# Patient Record
Sex: Female | Born: 1980 | Race: White | Hispanic: No | Marital: Married | Smoking: Never smoker
Health system: Southern US, Community
[De-identification: ages and names within clinical notes are randomized; demographics above are authoritative.]

## PROBLEM LIST (undated history)

## (undated) DIAGNOSIS — Z789 Other specified health status: Secondary | ICD-10-CM

## (undated) DIAGNOSIS — K08409 Partial loss of teeth, unspecified cause, unspecified class: Secondary | ICD-10-CM

## (undated) HISTORY — PX: WISDOM TOOTH EXTRACTION: SHX21

---

## 2002-12-17 ENCOUNTER — Other Ambulatory Visit: Admission: RE | Admit: 2002-12-17 | Discharge: 2002-12-17 | Payer: Self-pay | Admitting: Obstetrics and Gynecology

## 2003-05-24 ENCOUNTER — Observation Stay (HOSPITAL_COMMUNITY): Admission: AD | Admit: 2003-05-24 | Discharge: 2003-05-25 | Payer: Self-pay | Admitting: Obstetrics and Gynecology

## 2003-06-08 ENCOUNTER — Inpatient Hospital Stay (HOSPITAL_COMMUNITY): Admission: AD | Admit: 2003-06-08 | Discharge: 2003-06-10 | Payer: Self-pay | Admitting: Obstetrics and Gynecology

## 2003-06-11 ENCOUNTER — Encounter: Admission: RE | Admit: 2003-06-11 | Discharge: 2003-07-11 | Payer: Self-pay | Admitting: Obstetrics and Gynecology

## 2003-07-21 ENCOUNTER — Other Ambulatory Visit: Admission: RE | Admit: 2003-07-21 | Discharge: 2003-07-21 | Payer: Self-pay | Admitting: Obstetrics and Gynecology

## 2005-07-14 IMAGING — US US OB COMP +14 WK
1 series · 13 of 28 positions shown · non-contrast
Comparison: None.

CLINICAL DATA: Approximately 30 weeks gestation with vaginal bleeding.

[Series 1: unknown · 0.37mm/px · 13 of 30 slices shown]
[im 2/30]
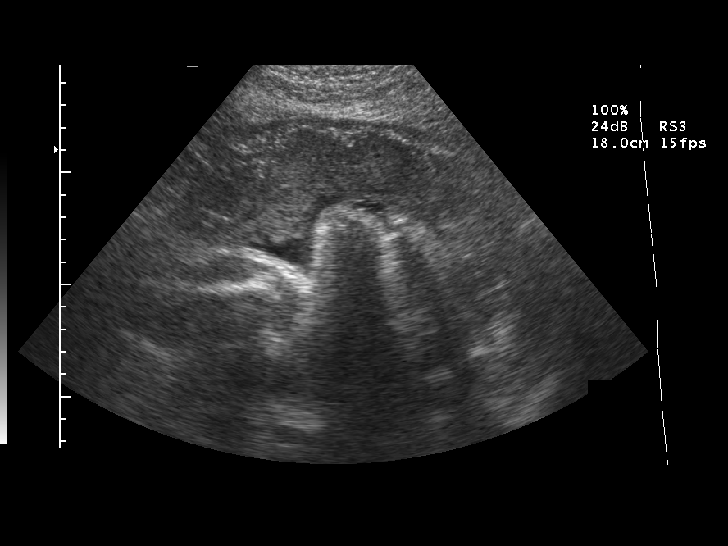
[im 4/30]
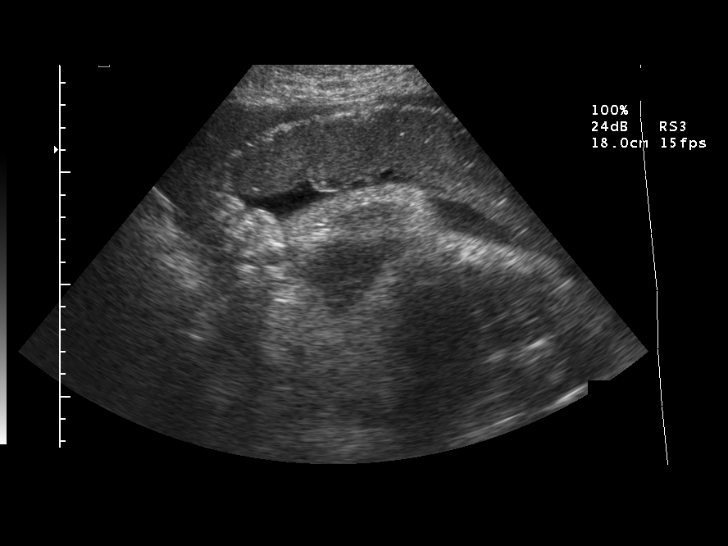
[im 6/30]
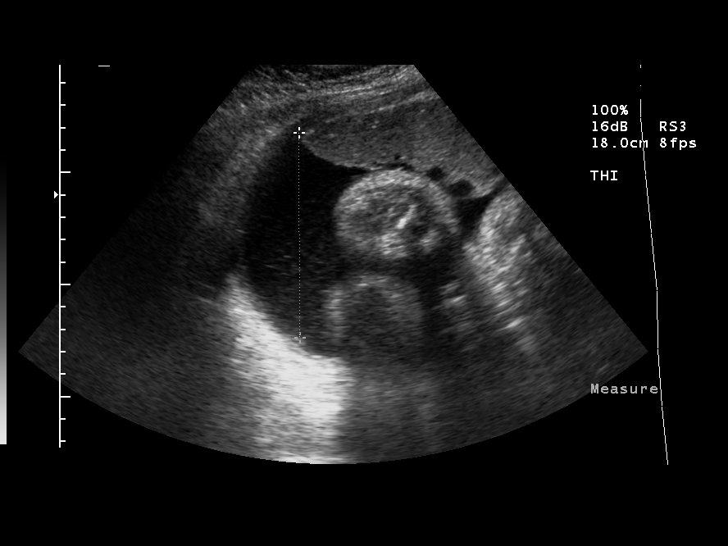
[im 8/30]
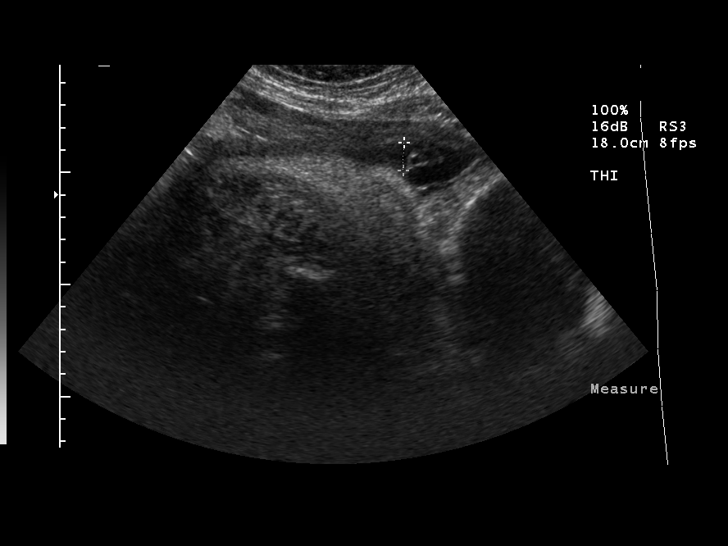
[im 10/30]
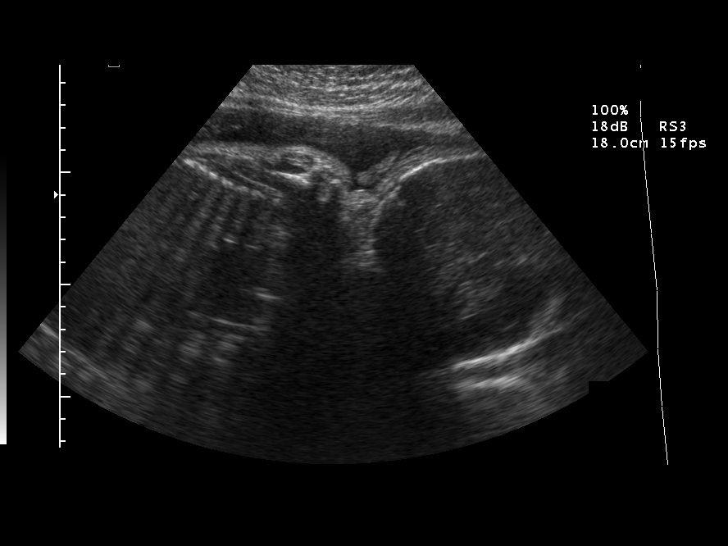
[im 12/30]
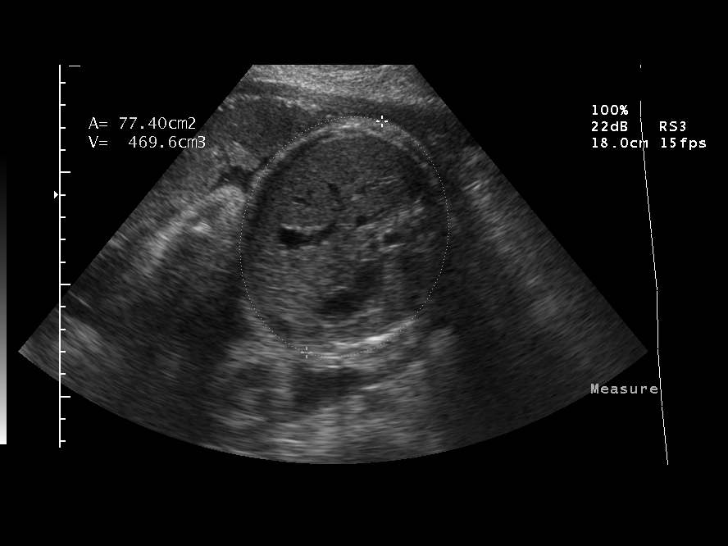
[im 16/30]
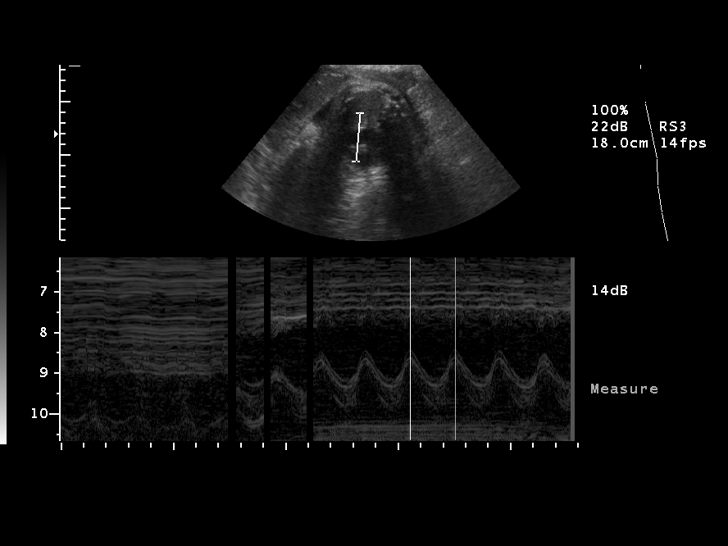
[im 18/30]
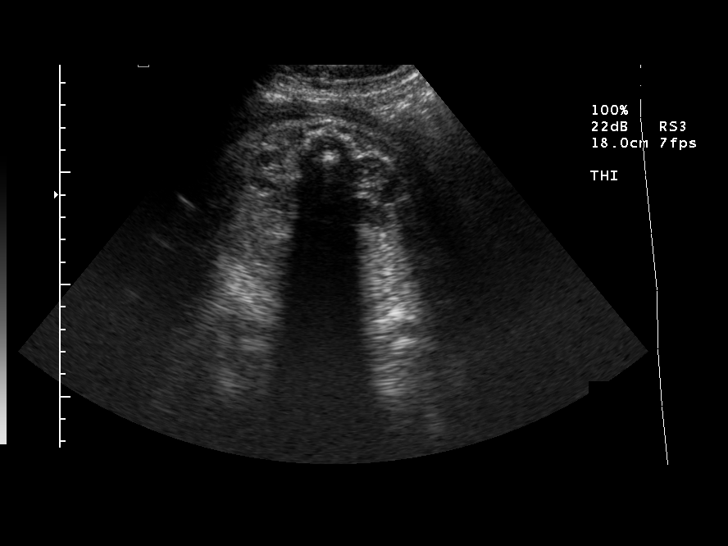
[im 20/30]
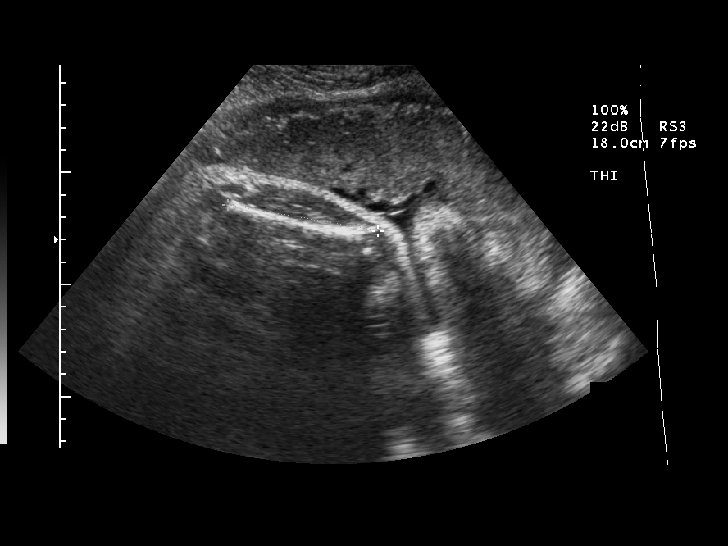
[im 22/30]
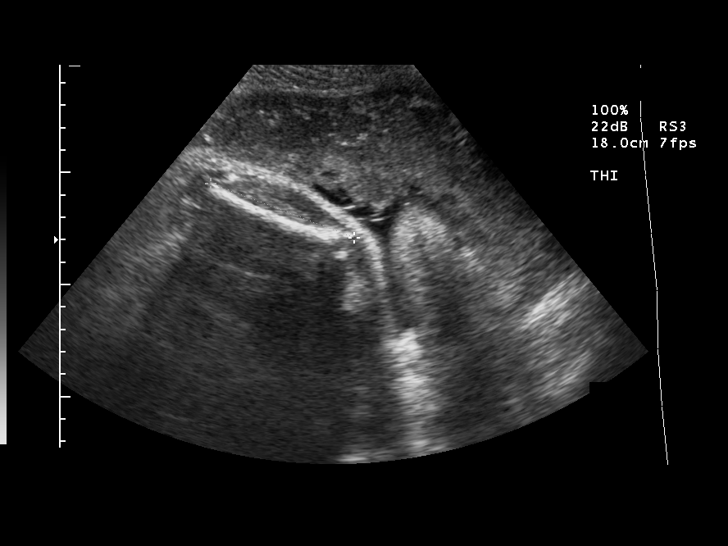
[im 24/30]
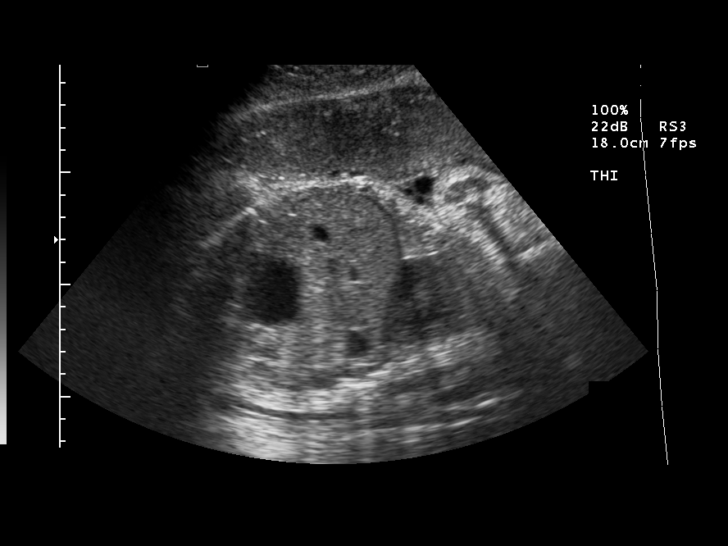
[im 26/30]
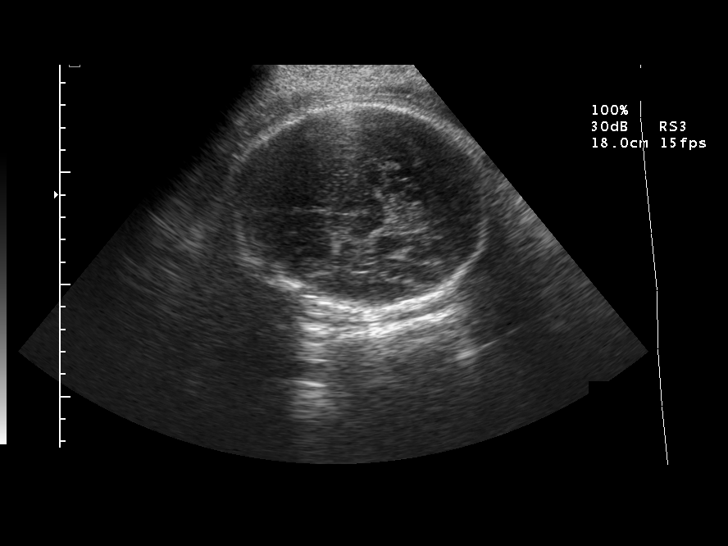
[im 28/30]
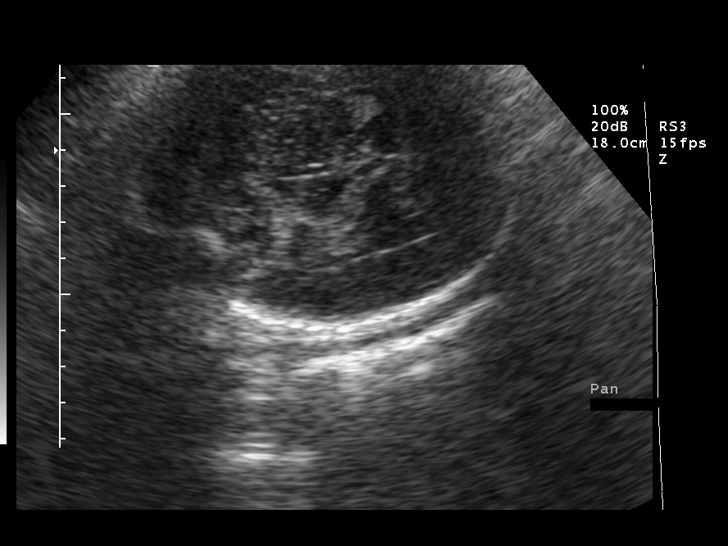

[13 of 28 positions shown; findings below may reference images not displayed]

OBSTETRICAL ULTRASOUND, 05/24/03

 NUMBER OF FETUSES:  1
 HEART RATE:   150 bpm
 MOVEMENT:  Yes
 BREATHING:  No  
 PRESENTATION:  Cephalic
 PLACENTAL LOCATION:  Anterior
 GRADE:  II
 PREVIA:  No
 AMNIOTIC FLUID (SUBJECTIVE):  Normal
 AMNIOTIC FLUID (OBJECTIVE):   16.9 cm (7.9 cm = 5th%ile; 95th %ile = 24.9 cm)
 FETAL BIOMETRY
 BPD:   9.0 cm  36W 3D
 HC:   32.3 cm  36W 3D
 AC:  31.5 cm  35W 3D
 FL:  6.8 cm  35W 0D
 MEAN GA:      35W 6D

 EFW:   2606 g (S) 54th-56th %ile (0895-7666 g) for 35 weeks

 FETAL ANATOMY
 LATERAL VENTRICLES:  Visualized 
 THALAMI/CSP:      Not visualized 
 POSTERIOR FOSSA:  Not visualized 
 NUCHAL REGION:  N/A
 SPINE:      Not visualized 
 4 CHAMBER HEART ON LEFT:      Not visualized 
 STOMACH ON LEFT:      Visualized 
 3 TIGER CORD:  Not visualized 
 CORD INSERTION SITE:  Not visualized 
 KIDNEYS:  Visualized 
 BLADDER:  Visualized 
 EXTREMITIES:      Not visualized 

 ADDITIONAL ANATOMY VISUALIZED:  Diaphragm.

 EVALUATION LIMITED BY:  Advanced gestational age.

 MATERNAL FINDINGS
 CERVIX:   4.0 cm translabially.
 IMPRESSION 
 Single living intrauterine fetus in cephalic presentation with subjective subjectively and quantitatively normal amniotic fluid volume.  Mean gestational age by fetal biometry is 35 weeks 6 days, which correlates well with the assigned gestational age at 35 weeks 1 day.
 The visualized fetal anatomy is limited by the advanced gestational age.

## 2006-09-05 ENCOUNTER — Inpatient Hospital Stay (HOSPITAL_COMMUNITY): Admission: RE | Admit: 2006-09-05 | Discharge: 2006-09-07 | Payer: Self-pay | Admitting: Obstetrics and Gynecology

## 2014-03-25 LAB — OB RESULTS CONSOLE ABO/RH: RH Type: POSITIVE

## 2014-03-25 LAB — OB RESULTS CONSOLE HIV ANTIBODY (ROUTINE TESTING): HIV: NONREACTIVE

## 2014-03-25 LAB — OB RESULTS CONSOLE RUBELLA ANTIBODY, IGM: Rubella: IMMUNE

## 2014-03-25 LAB — OB RESULTS CONSOLE ANTIBODY SCREEN: Antibody Screen: NEGATIVE

## 2014-03-25 LAB — OB RESULTS CONSOLE RPR: RPR: NONREACTIVE

## 2014-03-25 LAB — OB RESULTS CONSOLE HEPATITIS B SURFACE ANTIGEN: Hepatitis B Surface Ag: NEGATIVE

## 2014-04-01 LAB — OB RESULTS CONSOLE GC/CHLAMYDIA
Chlamydia: NEGATIVE
Gonorrhea: NEGATIVE

## 2014-07-15 NOTE — L&D Delivery Note (Signed)
Delivery Note At 1:43 PM a viable female was delivered via Vaginal, Spontaneous Delivery (Presentation: ; DOA ).  APGAR: 8, 9; weight  . pending  Placenta status: Intact, Spontaneous.  Cord: 2 vessels with the following complications: None.  Cord pH: not indicated  Anesthesia: Epidural  Episiotomy: None Lacerations: 2nd degree;Perineal;Periurethral Suture Repair: 3.0 vicryl rapide additional figure of 8 with 4-0 vicryl to close anterior periurethral laceration Est. Blood Loss (mL): 400 Some loss of uterine tone during perineal repair and methergine given. Plan close observation.   Mom to postpartum.  Baby to Couplet care / Skin to Skin.  Marlie Kuennen A. 10/21/2014, 2:29 PM

## 2014-09-13 ENCOUNTER — Inpatient Hospital Stay (HOSPITAL_COMMUNITY): Admission: AD | Admit: 2014-09-13 | Payer: Self-pay | Source: Ambulatory Visit | Admitting: Obstetrics

## 2014-10-06 LAB — OB RESULTS CONSOLE GBS: GBS: NEGATIVE

## 2014-10-13 ENCOUNTER — Telehealth (HOSPITAL_COMMUNITY): Payer: Self-pay | Admitting: *Deleted

## 2014-10-13 ENCOUNTER — Encounter (HOSPITAL_COMMUNITY): Payer: Self-pay | Admitting: *Deleted

## 2014-10-13 NOTE — Telephone Encounter (Signed)
Preadmission screen  

## 2014-10-19 ENCOUNTER — Other Ambulatory Visit: Payer: Self-pay | Admitting: Obstetrics

## 2014-10-21 ENCOUNTER — Inpatient Hospital Stay (HOSPITAL_COMMUNITY): Payer: Managed Care, Other (non HMO) | Admitting: Anesthesiology

## 2014-10-21 ENCOUNTER — Encounter (HOSPITAL_COMMUNITY): Payer: Self-pay

## 2014-10-21 ENCOUNTER — Inpatient Hospital Stay (HOSPITAL_COMMUNITY)
Admission: RE | Admit: 2014-10-21 | Discharge: 2014-10-22 | DRG: 775 | Disposition: A | Discharge status: Home / Self Care | Payer: Managed Care, Other (non HMO) | Source: Ambulatory Visit | Attending: Obstetrics | Admitting: Obstetrics

## 2014-10-21 DIAGNOSIS — Z833 Family history of diabetes mellitus: Secondary | ICD-10-CM | POA: Diagnosis not present

## 2014-10-21 DIAGNOSIS — O403XX0 Polyhydramnios, third trimester, not applicable or unspecified: Principal | ICD-10-CM | POA: Diagnosis present

## 2014-10-21 DIAGNOSIS — Z3A39 39 weeks gestation of pregnancy: Secondary | ICD-10-CM | POA: Diagnosis present

## 2014-10-21 DIAGNOSIS — Z3490 Encounter for supervision of normal pregnancy, unspecified, unspecified trimester: Secondary | ICD-10-CM

## 2014-10-21 HISTORY — DX: Partial loss of teeth, unspecified cause, unspecified class: K08.409

## 2014-10-21 HISTORY — DX: Other specified health status: Z78.9

## 2014-10-21 LAB — TYPE AND SCREEN
ABO/RH(D): AB POS
Antibody Screen: NEGATIVE

## 2014-10-21 LAB — CBC
HCT: 37.2 % (ref 36.0–46.0)
HEMOGLOBIN: 12.9 g/dL (ref 12.0–15.0)
MCH: 30.9 pg (ref 26.0–34.0)
MCHC: 34.7 g/dL (ref 30.0–36.0)
MCV: 89 fL (ref 78.0–100.0)
Platelets: 187 10*3/uL (ref 150–400)
RBC: 4.18 MIL/uL (ref 3.87–5.11)
RDW: 12.8 % (ref 11.5–15.5)
WBC: 8 10*3/uL (ref 4.0–10.5)

## 2014-10-21 LAB — ABO/RH: ABO/RH(D): AB POS

## 2014-10-21 LAB — RPR: RPR Ser Ql: NONREACTIVE

## 2014-10-21 MED ORDER — EPHEDRINE 5 MG/ML INJ
10.0000 mg | INTRAVENOUS | Status: DC | PRN
  Filled 2014-10-21: qty 2

## 2014-10-21 MED ORDER — LANOLIN HYDROUS EX OINT: TOPICAL_OINTMENT | CUTANEOUS | Status: DC | PRN

## 2014-10-21 MED ORDER — ONDANSETRON HCL 4 MG/2ML IJ SOLN: 4.0000 mg | INTRAMUSCULAR | Status: DC | PRN

## 2014-10-21 MED ORDER — LACTATED RINGERS IV SOLN: 500.0000 mL | INTRAVENOUS | Status: DC | PRN

## 2014-10-21 MED ORDER — OXYTOCIN 40 UNITS IN LACTATED RINGERS INFUSION - SIMPLE MED
62.5000 mL/h | INTRAVENOUS | Status: DC
  Administered 2014-10-21: 62.5 mL/h via INTRAVENOUS

## 2014-10-21 MED ORDER — CITRIC ACID-SODIUM CITRATE 334-500 MG/5ML PO SOLN: 30.0000 mL | ORAL | Status: DC | PRN

## 2014-10-21 MED ORDER — FENTANYL 2.5 MCG/ML BUPIVACAINE 1/10 % EPIDURAL INFUSION (WH - ANES)
INTRAMUSCULAR | Status: AC
  Administered 2014-10-21: 14 mL/h via EPIDURAL
  Filled 2014-10-21: qty 125

## 2014-10-21 MED ORDER — OXYCODONE-ACETAMINOPHEN 5-325 MG PO TABS: 1.0000 | ORAL_TABLET | ORAL | Status: DC | PRN

## 2014-10-21 MED ORDER — ONDANSETRON HCL 4 MG PO TABS: 4.0000 mg | ORAL_TABLET | ORAL | Status: DC | PRN

## 2014-10-21 MED ORDER — FENTANYL 2.5 MCG/ML BUPIVACAINE 1/10 % EPIDURAL INFUSION (WH - ANES)
14.0000 mL/h | INTRAMUSCULAR | Status: DC | PRN
  Administered 2014-10-21: 14 mL/h via EPIDURAL

## 2014-10-21 MED ORDER — LACTATED RINGERS IV SOLN
500.0000 mL | Freq: Once | INTRAVENOUS | Status: AC
  Administered 2014-10-21: 500 mL via INTRAVENOUS

## 2014-10-21 MED ORDER — ACETAMINOPHEN 325 MG PO TABS: 650.0000 mg | ORAL_TABLET | ORAL | Status: DC | PRN

## 2014-10-21 MED ORDER — PRENATAL MULTIVITAMIN CH
1.0000 | ORAL_TABLET | Freq: Every day | ORAL | Status: DC
  Administered 2014-10-22: 1 via ORAL
  Filled 2014-10-21: qty 1

## 2014-10-21 MED ORDER — OXYTOCIN BOLUS FROM INFUSION: 500.0000 mL | INTRAVENOUS | Status: DC

## 2014-10-21 MED ORDER — METHYLERGONOVINE MALEATE 0.2 MG/ML IJ SOLN: 0.2000 mg | Freq: Once | INTRAMUSCULAR | Status: DC

## 2014-10-21 MED ORDER — TETANUS-DIPHTH-ACELL PERTUSSIS 5-2.5-18.5 LF-MCG/0.5 IM SUSP: 0.5000 mL | Freq: Once | INTRAMUSCULAR | Status: DC

## 2014-10-21 MED ORDER — METHYLERGONOVINE MALEATE 0.2 MG/ML IJ SOLN
INTRAMUSCULAR | Status: AC
  Administered 2014-10-21: 0.2 mg
  Filled 2014-10-21: qty 1

## 2014-10-21 MED ORDER — FLEET ENEMA 7-19 GM/118ML RE ENEM: 1.0000 | ENEMA | Freq: Every day | RECTAL | Status: DC | PRN

## 2014-10-21 MED ORDER — LIDOCAINE HCL (PF) 1 % IJ SOLN
INTRAMUSCULAR | Status: DC | PRN
  Administered 2014-10-21: 4 mL

## 2014-10-21 MED ORDER — SIMETHICONE 80 MG PO CHEW: 80.0000 mg | CHEWABLE_TABLET | ORAL | Status: DC | PRN

## 2014-10-21 MED ORDER — OXYCODONE-ACETAMINOPHEN 5-325 MG PO TABS: 2.0000 | ORAL_TABLET | ORAL | Status: DC | PRN

## 2014-10-21 MED ORDER — SENNOSIDES-DOCUSATE SODIUM 8.6-50 MG PO TABS
2.0000 | ORAL_TABLET | ORAL | Status: DC
  Administered 2014-10-22: 1 via ORAL
  Filled 2014-10-21: qty 2

## 2014-10-21 MED ORDER — PHENYLEPHRINE 40 MCG/ML (10ML) SYRINGE FOR IV PUSH (FOR BLOOD PRESSURE SUPPORT)
80.0000 ug | PREFILLED_SYRINGE | INTRAVENOUS | Status: DC | PRN
  Filled 2014-10-21: qty 2

## 2014-10-21 MED ORDER — LIDOCAINE HCL (PF) 1 % IJ SOLN
30.0000 mL | INTRAMUSCULAR | Status: DC | PRN
  Filled 2014-10-21: qty 30

## 2014-10-21 MED ORDER — DIPHENHYDRAMINE HCL 25 MG PO CAPS: 25.0000 mg | ORAL_CAPSULE | Freq: Four times a day (QID) | ORAL | Status: DC | PRN

## 2014-10-21 MED ORDER — DIBUCAINE 1 % RE OINT: 1.0000 "application " | TOPICAL_OINTMENT | RECTAL | Status: DC | PRN

## 2014-10-21 MED ORDER — OXYTOCIN 40 UNITS IN LACTATED RINGERS INFUSION - SIMPLE MED
1.0000 m[IU]/min | INTRAVENOUS | Status: DC
  Administered 2014-10-21: 2 m[IU]/min via INTRAVENOUS
  Filled 2014-10-21: qty 1000

## 2014-10-21 MED ORDER — BISACODYL 10 MG RE SUPP: 10.0000 mg | Freq: Every day | RECTAL | Status: DC | PRN

## 2014-10-21 MED ORDER — FENTANYL 2.5 MCG/ML BUPIVACAINE 1/10 % EPIDURAL INFUSION (WH - ANES)
INTRAMUSCULAR | Status: DC | PRN
  Administered 2014-10-21: 14 mL/h via EPIDURAL

## 2014-10-21 MED ORDER — IBUPROFEN 600 MG PO TABS
600.0000 mg | ORAL_TABLET | Freq: Four times a day (QID) | ORAL | Status: DC
  Administered 2014-10-21 – 2014-10-22 (×4): 600 mg via ORAL
  Filled 2014-10-21 (×4): qty 1

## 2014-10-21 MED ORDER — BENZOCAINE-MENTHOL 20-0.5 % EX AERO
1.0000 "application " | INHALATION_SPRAY | CUTANEOUS | Status: DC | PRN
  Administered 2014-10-21: 1 via TOPICAL
  Filled 2014-10-21: qty 56

## 2014-10-21 MED ORDER — ZOLPIDEM TARTRATE 5 MG PO TABS: 5.0000 mg | ORAL_TABLET | Freq: Every evening | ORAL | Status: DC | PRN

## 2014-10-21 MED ORDER — WITCH HAZEL-GLYCERIN EX PADS: 1.0000 "application " | MEDICATED_PAD | CUTANEOUS | Status: DC | PRN

## 2014-10-21 MED ORDER — DIPHENHYDRAMINE HCL 50 MG/ML IJ SOLN: 12.5000 mg | INTRAMUSCULAR | Status: DC | PRN

## 2014-10-21 MED ORDER — ONDANSETRON HCL 4 MG/2ML IJ SOLN: 4.0000 mg | Freq: Four times a day (QID) | INTRAMUSCULAR | Status: DC | PRN

## 2014-10-21 MED ORDER — PHENYLEPHRINE 40 MCG/ML (10ML) SYRINGE FOR IV PUSH (FOR BLOOD PRESSURE SUPPORT)
PREFILLED_SYRINGE | INTRAVENOUS | Status: AC
  Filled 2014-10-21: qty 20

## 2014-10-21 MED ORDER — TERBUTALINE SULFATE 1 MG/ML IJ SOLN
0.2500 mg | Freq: Once | INTRAMUSCULAR | Status: DC | PRN
  Filled 2014-10-21: qty 1

## 2014-10-21 MED ORDER — LACTATED RINGERS IV SOLN
INTRAVENOUS | Status: DC
Start: 2014-10-21 — End: 2014-10-21
  Administered 2014-10-21 (×2): via INTRAVENOUS

## 2014-10-21 NOTE — Progress Notes (Signed)
Subjective: G3P2 at 39+ wks Induction Polyhydramnios/2 vessel cord Doing well, pain controled, UCs q2-3 min  Anesthesia epidural   Objective: BP 114/69 mmHg  Pulse 70  Temp(Src) 98.1 F (36.7 C) (Oral)  Resp 18  Ht 5\' 6"  (1.676 m)  Wt 180 lb (81.647 kg)  BMI 29.07 kg/m2  SpO2 98%  LMP 01/19/2014   FHT:  130/min with good variability and accelerations.  No deceleration UC:   regular, every 2-3 minutes VE:   3/85%/Vtx/-2-1 fixed.  AROM AF clear +++   Assessment / Plan: Induction of labor at 39+ wks multiparous with Polyhydramnios/2 vessel cord.  Pito/AROM entering active labor.    Fetal Wellbeing:  Category I Pain Control:  Epidural  Anticipated MOD:  NSVD  Cynthia Estes,Cynthia Estes 10/21/2014, 11:13 AM

## 2014-10-21 NOTE — Anesthesia Preprocedure Evaluation (Signed)
Anesthesia Evaluation  Patient identified by MRN, date of birth, ID band Patient awake    Reviewed: Allergy & Precautions, NPO status , Patient's Chart, lab work & pertinent test results  Airway Mallampati: II  TM Distance: >3 FB Neck ROM: Full    Dental no notable dental hx. (+) Dental Advisory Given   Pulmonary neg pulmonary ROS,  breath sounds clear to auscultation  Pulmonary exam normal       Cardiovascular negative cardio ROS  Rhythm:Regular Rate:Normal     Neuro/Psych negative neurological ROS  negative psych ROS   GI/Hepatic negative GI ROS, Neg liver ROS,   Endo/Other  negative endocrine ROS  Renal/GU negative Renal ROS  negative genitourinary   Musculoskeletal negative musculoskeletal ROS (+)   Abdominal   Peds negative pediatric ROS (+)  Hematology negative hematology ROS (+)   Anesthesia Other Findings   Reproductive/Obstetrics (+) Pregnancy                             Anesthesia Physical Anesthesia Plan  ASA: II  Anesthesia Plan: Epidural   Post-op Pain Management:    Induction:   Airway Management Planned:   Additional Equipment:   Intra-op Plan:   Post-operative Plan:   Informed Consent: I have reviewed the patients History and Physical, chart, labs and discussed the procedure including the risks, benefits and alternatives for the proposed anesthesia with the patient or authorized representative who has indicated his/her understanding and acceptance.   Dental advisory given  Plan Discussed with:   Anesthesia Plan Comments:         Anesthesia Quick Evaluation

## 2014-10-21 NOTE — H&P (Signed)
Cynthia Estes is a 34 y.o. G3P1002 at 3315w2d presenting for elective IOL. Pt notes rare contractions. Good fetal movement, No vaginal bleeding, not leaking fluid  [facility] first trimester wks - DS 185, nl 3 hr - 2 VC - early previa with resolution - fetal growth 37 wks- AGA- 76%, 7'2, ant placenta   Prenatal Transfer Tool  Maternal Diabetes: No Genetic Screening: Normal Maternal Ultrasounds/Referrals: Normal Fetal Ultrasounds or other Referrals:  None Maternal Substance Abuse:  No Significant Maternal Medications:  None Significant Maternal Lab Results: None     OB History    Gravida Para Term Preterm AB TAB SAB Ectopic Multiple Living   3 2 1       2      Past Medical History  Diagnosis Date  . Medical history non-contributory    Past Surgical History  Procedure Laterality Date  . Wisdom tooth extraction     Family History: family history includes Diabetes in her maternal grandfather; Heart disease in her paternal grandfather and paternal grandmother. Social History:  reports that she has never smoked. She does not have any smokeless tobacco history on file. Her alcohol and drug histories are not on file.  Review of Systems - Negative except discomfort of preg   Dilation: 10 Effacement (%): 90 Station: 0 Exam by:: Dr. Ernestina Pennafogleman Blood pressure 105/70, pulse 72, temperature 97.8 F (36.6 C), temperature source Oral, resp. rate 18, height 5\' 6"  (1.676 m), weight 81.647 kg (180 lb), last menstrual period 01/19/2014, SpO2 98 %.  Physical Exam:  Gen: well appearing, no distress Abd: gravid, NT, no RUQ pain LE: no edema, equal bilaterally, non-tender   Prenatal labs: ABO, Rh: --/--/AB POS (04/08 0745) Antibody: NEG (04/08 0745) Rubella:  immune RPR: Nonreactive (09/11 0000)  HBsAg: Negative (09/11 0000)  HIV: Non-reactive (09/11 0000)  GBS: Negative (03/24 0000)  1 hr Glucola 185. Nl 3 hr  Genetic screening nl NT, nl AFP Anatomy US  nl    Assessment/Plan: 34 y.o. G3P1002 at 5715w2d Elective IOL. Pit/ AROM Reactive fetal testing GBS neg   Cynthia Estes A. 10/21/2014, 2:25 PM

## 2014-10-21 NOTE — Anesthesia Procedure Notes (Signed)
Epidural Patient location during procedure: OB Start time: 10/21/2014 10:30 AM  Staffing Anesthesiologist: Karie SchwalbeJUDD, Narek Kniss Performed by: anesthesiologist   Preanesthetic Checklist Completed: patient identified, site marked, surgical consent, pre-op evaluation, timeout performed, IV checked, risks and benefits discussed and monitors and equipment checked  Epidural Patient position: sitting Prep: site prepped and draped and DuraPrep Patient monitoring: continuous pulse ox and blood pressure Approach: midline Location: L3-L4 Injection technique: LOR saline  Needle:  Needle type: Tuohy  Needle gauge: 17 G Needle length: 9 cm and 9 Needle insertion depth: 6 cm Catheter type: closed end flexible Catheter size: 19 Gauge Catheter at skin depth: 10 cm Test dose: negative  Assessment Events: blood not aspirated, injection not painful, no injection resistance, negative IV test and no paresthesia  Additional Notes Patient identified. Risks/Benefits/Options discussed with patient including but not limited to bleeding, infection, nerve damage, paralysis, failed block, incomplete pain control, headache, blood pressure changes, nausea, vomiting, reactions to medication both or allergic, itching and postpartum back pain. Confirmed with bedside nurse the patient's most recent platelet count. Confirmed with patient that they are not currently taking any anticoagulation, have any bleeding history or any family history of bleeding disorders. Patient expressed understanding and wished to proceed. All questions were answered. Sterile technique was used throughout the entire procedure. Please see nursing notes for vital signs. Test dose was given through epidural catheter and negative prior to continuing to dose epidural or start infusion. Warning signs of high block given to the patient including shortness of breath, tingling/numbness in hands, complete motor block, or any concerning symptoms with instructions to  call for help. Patient was given instructions on fall risk and not to get out of bed. All questions and concerns addressed with instructions to call with any issues or inadequate analgesia.

## 2014-10-21 NOTE — Lactation Note (Signed)
This note was copied from the chart of Cynthia Leonel Ramsayracy Lamping. Lactation Consultation Note  Patient Name: Cynthia Estes XBJYN'WToday's Date: 10/21/2014 Reason for consult: Initial assessment (mom encouraged to call with feeding cues. )  Baby is 3 hours old and presently skin to skin under moms gown skin to skin , sound asleep. LC reviewed the benefits of skin to skin . Per mom the baby has been spitty and has not had a poop. LC encouraged mom to call for feeding cues on the nurses light. Per mom - did not breast feed her 1st baby or 2nd baby. Desires to try this time if it doesn't work out it is ok. Mother informed of post-discharge support and given phone number to the lactation department, including  services for phone call assistance; out-patient appointments; and breastfeeding support group. List of other  breastfeeding resources in the community given in the handout. Encouraged mother to call for problems or concerns related to breastfeeding.   Maternal Data Does the patient have breastfeeding experience prior to this delivery?: Yes  Feeding Feeding Type:  (per mom tried earlier, baby has been sleepy and spitty. ) Length of feed: 0 min  LATCH Score/Interventions Latch: Too sleepy or reluctant, no latch achieved, no sucking elicited. Intervention(s): Skin to skin;Teach feeding cues  Audible Swallowing: None Intervention(s): Skin to skin  Type of Nipple: Flat Intervention(s): Reverse pressure  Comfort (Breast/Nipple): Soft / non-tender     Hold (Positioning): Assistance needed to correctly position infant at breast and maintain latch. Intervention(s): Breastfeeding basics reviewed  LATCH Score: 4  Lactation Tools Discussed/Used WIC Program: No   Consult Status Consult Status: Follow-up Date: 10/21/14 Follow-up type: In-patient    Cynthia Estes, Cynthia Estes 10/21/2014, 5:11 PM

## 2014-10-22 ENCOUNTER — Encounter (HOSPITAL_COMMUNITY): Payer: Self-pay

## 2014-10-22 LAB — CBC
HEMATOCRIT: 32.7 % — AB (ref 36.0–46.0)
Hemoglobin: 11.5 g/dL — ABNORMAL LOW (ref 12.0–15.0)
MCH: 31.5 pg (ref 26.0–34.0)
MCHC: 35.2 g/dL (ref 30.0–36.0)
MCV: 89.6 fL (ref 78.0–100.0)
Platelets: 156 10*3/uL (ref 150–400)
RBC: 3.65 MIL/uL — ABNORMAL LOW (ref 3.87–5.11)
RDW: 12.9 % (ref 11.5–15.5)
WBC: 12.2 10*3/uL — ABNORMAL HIGH (ref 4.0–10.5)

## 2014-10-22 MED ORDER — IBUPROFEN 600 MG PO TABS: 600.0000 mg | ORAL_TABLET | Freq: Four times a day (QID) | ORAL | Status: AC

## 2014-10-22 NOTE — Progress Notes (Signed)
Patient ID: Cynthia Estes, female   DOB: [birth year], 34 y.o.   MRN: [id number] PPD # 1 SVD  S:  Reports feeling well, desires early discharge home             Tolerating po/ No nausea or vomiting             Bleeding is light             Pain controlled with ibuprofen (OTC)             Up ad lib / ambulatory / voiding without difficulties    Newborn  Information for the patient's newborn:  Nona DellJohnson, Boy [name] [[id number]][[id number]]  female  breast feeding  / Circumcision in progress   O:  A & O x 3, in no apparent distress              VS:  Filed Vitals:   10/21/14 1552 10/21/14 1655 10/21/14 2118 10/22/14 0540  BP: 109/69 107/68 99/53 97/58   Pulse: 71 80 84 72  Temp: 98.2 F (36.8 C) 98.3 F (36.8 C) 99 F (37.2 C) 98 F (36.7 C)  TempSrc: Oral Oral Oral Oral  Resp: 17 16 18 18   Height:      Weight:      SpO2:        LABS:  Recent Labs  10/21/14 0745 10/22/14 0615  WBC 8.0 12.2*  HGB 12.9 11.5*  HCT 37.2 32.7*  PLT 187 156    Blood type: AB POS (04/08 0745)  Rubella: Immune (09/11 0000)     Lungs: Clear and unlabored  Heart: regular rate and rhythm / no murmurs  Abdomen: soft, non-tender, non-distended              Fundus: firm, non-tender, U-3  Perineum: 2nd degree repair healing well, no edema, ecchymosis, erythema   Lochia: minimal  Extremities: no edema, no calf pain or tenderness, no Homans    A/P: PPD # 1  33 y.o., [id number]   Principal Problem:    Postpartum care following vaginal delivery (4/8)    Doing well - stable status  Routine post partum orders  Early discharge home today   Kawena Lyday, M, MSN, CNM 10/22/2014, 9:30 AM

## 2014-10-22 NOTE — Discharge Summary (Signed)
Obstetric Discharge Summary  Reason for Admission: Pt is a G3P2003 at 5976w2d admitted for elective IOL for Polyhydramnios / 2 vessel cord.  [facility] since 9.2 wks, with Dr. Ernestina PennaFogleman as primary provider.  Medications on Admission: Prescriptions prior to admission  Medication Sig Dispense Refill Last Dose  . Docosahexaenoic Acid (DHA PO) Take 1 tablet by mouth daily.   10/21/2014 at Unknown time  . Prenatal Vit-Fe Fumarate-FA (PRENATAL MULTIVITAMIN) TABS tablet Take 1 tablet by mouth daily at 12 noon.   10/21/2014 at Unknown time    Prenatal Labs: ABO, Rh: AB POS (04/08 0745)  Antibody: NEG (04/08 0745) Rubella: Immune (09/11 0000)   RPR: Non Reactive (04/08 0745)  HBsAg: Negative (09/11 0000)  HIV: Non-reactive (09/11 0000)  GTT : Abnormal 1 hr GTT / Normal 3 hr GTT GBS: Negative (03/24 0000)   Prenatal Procedures: ultrasound Intrapartum Course: Admitted for IOL for Polyhydramnios & 2 vessel cord / epidural for pain management / rapid progression to complete dilation / SVD of viable female with 2nd degree;Perineal;Periurethral repair by Dr. Ernestina PennaFogleman / no immediate postpartum complications noted Intrapartum Procedures: spontaneous vaginal delivery Postpartum Procedures: none Complications-Operative and Postpartum: 2nd degree perineal laceration and periurethral  Labs: HEMOGLOBIN  Date Value Ref Range Status  10/22/2014 11.5* 12.0 - 15.0 g/dL Final   HCT  Date Value Ref Range Status  10/22/2014 32.7* 36.0 - 46.0 % Final   Lab Results  Component Value Date   PLT 156 10/22/2014    Newborn Data: Live born female  Birth Weight: 8 lb (3630 g) APGAR: 8, 9  Home with mother.   Discharge Information: Date: 10/22/2014 Discharge Diagnoses:  Pt is a G3P2003 at 1576w2d S/P Term Pregnancy-delivered on 10/21/2014  Condition: stable Activity: pelvic rest Diet: routine Medications:    Medication List    TAKE these medications        DHA PO  Take 1  tablet by mouth daily.     ibuprofen 600 MG tablet  Commonly known as:  ADVIL,MOTRIN  Take 1 tablet (600 mg total) by mouth every 6 (six) hours.     prenatal multivitamin Tabs tablet  Take 1 tablet by mouth daily at 12 noon.       Instructions: [facility] instruction booklet has been given and reviewed Discharge to: home     Follow-up Information    [facility] Schedule an appointment as soon as possible for a visit in 6 weeks.   Specialty:  Obstetrics and Gynecology   Why:  postpartum visit   Contact information:   [address] [place] [id number] [phone]       Raelyn MoraAWSON, Irean Kendricks, Judie PetitM, MSN, CNM 10/22/2014, 10:58 AM

## 2014-10-22 NOTE — Discharge Instructions (Signed)
Breast Pumping Tips °If you are breastfeeding, there may be times when you cannot feed your baby directly. Returning to work or going on a trip are common examples. Pumping allows you to store breast milk and feed it to your baby later.  °You may not get much milk when you first start to pump. Your breasts should start to make more after a few days. If you pump at the times you usually feed your baby, you may be able to keep making enough milk to feed your baby without also using formula. The more often you pump, the more milk you will produce.  °WHEN SHOULD I PUMP?  °· You can begin to pump soon after delivery. However, some experts recommend waiting about 4 weeks before giving your infant a bottle to make sure breastfeeding is going well.  °· If you plan to return to work, begin pumping a few weeks before. This will help you develop techniques that work best for you. It also lets you build up a supply of breast milk.   °· When you are with your infant, feed on demand and pump after each feeding.   °· When you are away from your infant for several hours, pump for about 15 minutes every 2-3 hours. Pump both breasts at the same time if you can.   °· If your infant has a formula feeding, make sure to pump around the same time.     °· If you drink any alcohol, wait 2 hours before pumping.   °HOW DO I PREPARE TO PUMP? °Your let-down reflex is the natural reaction to stimulation that makes your breast milk flow. It is easier to stimulate this reflex when you are relaxed. Find relaxation techniques that work for you. If you have difficulty with your let-down reflex, try these methods:  °· Smell one of your infant's blankets or an item of clothing.   °· Look at a picture or video of your infant.   °· Sit in a quiet, private space.   °· Massage the breast you plan to pump.   °· Place soothing warmth on the breast.   °· Play relaxing music.   °WHAT ARE SOME GENERAL BREAST PUMPING TIPS? °· Wash your hands before you pump. You  do not need to wash your nipples or breasts. °· There are three ways to pump. °¨ You can use your hand to massage and compress your breast. °¨ You can use a handheld manual pump. °¨ You can use an electric pump.   °· Make sure the suction cup (flange) on the breast pump is the right size. Place the flange directly over the nipple. If it is the wrong size or placed the wrong way, it may be painful and cause nipple damage.   °· If pumping is uncomfortable, apply a small amount of purified or modified lanolin to your nipple and areola. °· If you are using an electric pump, adjust the speed and suction power to be more comfortable. °· If pumping is painful or if you are not getting very much milk, you may need a different type of pump. A lactation consultant can help you determine what type of pump to use.   °· Keep a full water bottle near you at all times. Drinking lots of fluid helps you make more milk.  °· You can store your milk to use later. Pumped breast milk can be stored in a sealable, sterile container or plastic bag. Label all stored breast milk with the date you pumped it. °¨ Milk can stay out at room temperature for up to 8 hours. °¨   You can store your milk in the refrigerator for up to 8 days. °¨ You can store your milk in the freezer for 3 months. Thaw frozen milk using warm water. Do not put it in the microwave. °· Do not smoke. Smoking can lower your milk supply and harm your infant. If you need help quitting, ask your health care provider to recommend a program.   °WHEN SHOULD I CALL MY HEALTH CARE PROVIDER OR A LACTATION CONSULTANT? °· You are having trouble pumping. °· You are concerned that you are not making enough milk. °· You have nipple pain, soreness, or redness. °· You want to use birth control. Birth control pills may lower your milk supply. Talk to your health care provider about your options. °Document Released: 12/19/2009 Document Revised: 07/06/2013 Document Reviewed:  04/23/2013 °Exit[employer]® Patient Information ©2015 [employer]. This information is not intended to replace advice given to you by your health care provider. Make sure you discuss any questions you have with your health care provider. ° °Nutrition for the New Mother  °A new mother needs good health and nutrition so she can have energy to take care of a new baby. Whether a mother breastfeeds or formula feeds the baby, it is important to have a well-balanced diet. Foods from all the food groups should be chosen to meet the new mother's energy needs and to give her the nutrients needed for repair and healing.  °A HEALTHY EATING PLAN °The My [employer] plan for Moms outlines what you should eat to help you and your baby stay healthy. The energy and amount of food you need depends on whether or not you are breastfeeding. If you are breastfeeding you will need more nutrients. If you choose not to breastfeed, your nutrition goal should be to return to a healthy weight. Limiting calories may be needed if you are not breastfeeding.  °HOME CARE INSTRUCTIONS  °· For a personal plan based on your unique needs, see your Registered Dietitian or visit www.mypyramid.gov. °· Eat a variety of foods. The plan below will help guide you. The following chart has a suggested daily meal plan from the [employer] for Moms. °· Eat a variety of fruits and vegetables. °· Eat more dark green and orange vegetables and cooked dried beans. °· Make half your grains whole grains. Choose whole instead of refined grains. °· Choose low-fat or lean meats and poultry. °· Choose low-fat or fat-free dairy products like milk, cheese, or yogurt. °Fruits °· Breastfeeding: 2 cups °· Non-Breastfeeding: 2 cups °· What Counts as a serving? °¨ 1 cup of fruit or juice. °¨ ½ cup dried fruit. °Vegetables °· Breastfeeding: 3 cups °· Non-Breastfeeding: 2 ½ cups °· What Counts as a serving? °¨ 1 cup raw or cooked vegetables. °¨ Juice or 2 cups raw leafy  vegetables. °Grains °· Breastfeeding: 8 oz °· Non-Breastfeeding: 6 oz °· What Counts as a serving? °¨ 1 slice bread. °¨ 1 oz ready-to-eat cereal. °¨ ½ cup cooked pasta, rice, or cereal. °Meat and Beans °· Breastfeeding: 6 ½ oz °· Non-Breastfeeding: 5 ½ oz °· What Counts as a serving? °¨ 1 oz lean meat, poultry, or fish °¨ ¼ cup cooked dry beans °¨ ½ oz nuts or 1 egg °¨ 1 tbs peanut butter °Milk °· Breastfeeding: 3 cups °· Non-Breastfeeding: 3 cups °· What Counts as a serving? °¨ 1 cup milk. °¨ 8 oz yogurt. °¨ 1 ½ oz cheese. °¨ 2 oz processed cheese. °TIPS FOR THE BREASTFEEDING MOM °· Rapid weight   loss is not suggested when you are breastfeeding. By simply breastfeeding, you will be able to lose the weight gained during your pregnancy. Your caregiver can keep track of your weight and tell you if your weight loss is appropriate. °· Be sure to drink fluids. You may notice that you are thirstier than usual. A suggestion is to drink a glass of water or other beverage whenever you breastfeed. °· Avoid alcohol as it can be passed into your breast milk. °· Limit caffeine drinks to no more than 2 to 3 cups per day. °· You may need to keep taking your prenatal vitamin while you are breastfeeding. Talk with your caregiver about taking a vitamin or supplement. °RETURING TO A HEALTHY WEIGHT °· The My [employer] Plan for Moms will help you return to a healthy weight. It will also provide the nutrients you need. °· You may need to limit "empty" calories. These include: °¨ High fat foods like fried foods, fatty meats, fast food, butter, and mayonnaise. °¨ High sugar foods like sodas, jelly, candy, and sweets. °· Be physically active. Include 30 minutes of exercise or more each day. Choose an activity you like such as walking, swimming, biking, or aerobics. Check with your caregiver before you start to exercise. °Document Released: 10/08/2007 Document Revised: 09/23/2011 Document Reviewed: 10/08/2007 °ExitCare® Patient Information  ©2015 [employer]. This information is not intended to replace advice given to you by your health care provider. Make sure you discuss any questions you have with your health care provider. °Postpartum Depression and Baby Blues °The postpartum period begins right after the birth of a baby. During this time, there is often a great amount of joy and excitement. It is also a time of many changes in the life of the parents. Regardless of how many times a mother gives birth, each child brings new challenges and dynamics to the family. It is not unusual to have feelings of excitement along with confusing shifts in moods, emotions, and thoughts. All mothers are at risk of developing postpartum depression or the "baby blues." These mood changes can occur right after giving birth, or they may occur many months after giving birth. The baby blues or postpartum depression can be mild or severe. Additionally, postpartum depression can go away rather quickly, or it can be a long-term condition.  °CAUSES °Raised hormone levels and the rapid drop in those levels are thought to be a main cause of postpartum depression and the baby blues. A number of hormones change during and after pregnancy. Estrogen and progesterone usually decrease right after the delivery of your baby. The levels of thyroid hormone and various cortisol steroids also rapidly drop. Other factors that play a role in these mood changes include major life events and genetics.  °RISK FACTORS °If you have any of the following risks for the baby blues or postpartum depression, know what symptoms to watch out for during the postpartum period. Risk factors that may increase the likelihood of getting the baby blues or postpartum depression include: °· Having a personal or family history of depression.   °· Having depression while being pregnant.   °· Having premenstrual mood issues or mood issues related to oral contraceptives. °· Having a lot of life stress.   °· Having  marital conflict.   °· Lacking a social support network.   °· Having a baby with special needs.   °· Having health problems, such as diabetes.   °SIGNS AND SYMPTOMS °Symptoms of baby blues include: °· Brief changes in mood, such as going   from extreme happiness to sadness. °· Decreased concentration.   °· Difficulty sleeping.   °· Crying spells, tearfulness.   °· Irritability.   °· Anxiety.   °Symptoms of postpartum depression typically begin within the first month after giving birth. These symptoms include: °· Difficulty sleeping or excessive sleepiness.   °· Marked weight loss.   °· Agitation.   °· Feelings of worthlessness.   °· Lack of interest in activity or food.   °Postpartum psychosis is a very serious condition and can be dangerous. Fortunately, it is rare. Displaying any of the following symptoms is cause for immediate medical attention. Symptoms of postpartum psychosis include:  °· Hallucinations and delusions.   °· Bizarre or disorganized behavior.   °· Confusion or disorientation.   °DIAGNOSIS  °A diagnosis is made by an evaluation of your symptoms. There are no medical or lab tests that lead to a diagnosis, but there are various questionnaires that a health care provider may use to identify those with the baby blues, postpartum depression, or psychosis. Often, a screening tool called the Edinburgh Postnatal Depression Scale is used to diagnose depression in the postpartum period.  °TREATMENT °The baby blues usually goes away on its own in 1-2 weeks. Social support is often all that is needed. You will be encouraged to get adequate sleep and rest. Occasionally, you may be given medicines to help you sleep.  °Postpartum depression requires treatment because it can last several months or longer if it is not treated. Treatment may include individual or group therapy, medicine, or both to address any social, physiological, and psychological factors that may play a role in the depression. Regular exercise, a  healthy diet, rest, and social support may also be strongly recommended.  °Postpartum psychosis is more serious and needs treatment right away. Hospitalization is often needed. °HOME CARE INSTRUCTIONS °· Get as much rest as you can. Nap when the baby sleeps.   °· Exercise regularly. Some women find yoga and walking to be beneficial.   °· Eat a balanced and nourishing diet.   °· Do little things that you enjoy. Have a cup of tea, take a bubble bath, read your favorite magazine, or listen to your favorite music. °· Avoid alcohol.   °· Ask for help with household chores, cooking, grocery shopping, or running errands as needed. Do not try to do everything.   °· Talk to people close to you about how you are feeling. Get support from your partner, family members, friends, or other new moms. °· Try to stay positive in how you think. Think about the things you are grateful for.   °· Do not spend a lot of time alone.   °· Only take over-the-counter or prescription medicine as directed by your health care provider. °· Keep all your postpartum appointments.   °· Let your health care provider know if you have any concerns.   °SEEK MEDICAL CARE IF: °You are having a reaction to or problems with your medicine. °SEEK IMMEDIATE MEDICAL CARE IF: °· You have suicidal feelings.   °· You think you may harm the baby or someone else. °MAKE SURE YOU: °· Understand these instructions. °· Will watch your condition. °· Will get help right away if you are not doing well or get worse. °Document Released: 04/04/2004 Document Revised: 07/06/2013 Document Reviewed: 04/12/2013 °Exit[employer]® Patient Information ©2015 [employer]. This information is not intended to replace advice given to you by your health care provider. Make sure you discuss any questions you have with your health care provider. °Breastfeeding and Mastitis °Mastitis is inflammation of the breast tissue. It can occur in women who   are breastfeeding. This can make breastfeeding  painful. Mastitis will sometimes go away on its own. Your health care provider will help determine if treatment is needed. °CAUSES °Mastitis is often associated with a blocked milk (lactiferous) duct. This can happen when too much milk builds up in the breast. Causes of excess milk in the breast can include: °· Poor latch-on. If your baby is not latched onto the breast properly, she or he may not empty your breast completely while breastfeeding. °· Allowing too much time to pass between feedings. °· Wearing a bra or other clothing that is too tight. This puts extra pressure on the lactiferous ducts so milk does not flow through them as it should. °Mastitis can also be caused by a bacterial infection. Bacteria may enter the breast tissue through cuts or openings in the skin. In women who are breastfeeding, this may occur because of cracked or irritated skin. Cracks in the skin are often caused when your baby does not latch on properly to the breast. °SIGNS AND SYMPTOMS °· Swelling, redness, tenderness, and pain in an area of the breast. °· Swelling of the glands under the arm on the same side. °· Fever may or may not accompany mastitis. °If an infection is allowed to progress, a collection of pus (abscess) may develop. °DIAGNOSIS  °Your health care provider can usually diagnose mastitis based on your symptoms and a physical exam. Tests may be done to help confirm the diagnosis. These may include: °· Removal of pus from the breast by applying pressure to the area. This pus can be examined in the lab to determine which bacteria are present. If an abscess has developed, the fluid in the abscess can be removed with a needle. This can also be used to confirm the diagnosis and determine the bacteria present. In most cases, pus will not be present. °· Blood tests to determine if your body is fighting a bacterial infection. °· Mammogram or ultrasound tests to rule out other problems or diseases. °TREATMENT  °Mastitis that  occurs with breastfeeding will sometimes go away on its own. Your health care provider may choose to wait 24 hours after first seeing you to decide whether a prescription medicine is needed. If your symptoms are worse after 24 hours, your health care provider will likely prescribe an antibiotic medicine to treat the mastitis. He or she will determine which bacteria are most likely causing the infection and will then select an appropriate antibiotic medicine. This is sometimes changed based on the results of tests performed to identify the bacteria, or if there is no response to the antibiotic medicine selected. Antibiotic medicines are usually given by mouth. You may also be given medicine for pain. °HOME CARE INSTRUCTIONS °· Only take over-the-counter or prescription medicines for pain, fever, or discomfort as directed by your health care provider. °· If your health care provider prescribed an antibiotic medicine, take the medicine as directed. Make sure you finish it even if you start to feel better. °· Do not wear a tight or underwire bra. Wear a soft, supportive bra. °· Increase your fluid intake, especially if you have a fever. °· Continue to empty the breast. Your health care provider can tell you whether this milk is safe for your infant or needs to be thrown out. You may be told to stop nursing until your health care provider thinks it is safe for your baby. Use a breast pump if you are advised to stop nursing. °· Keep your nipples   clean and dry. °· Empty the first breast completely before going to the other breast. If your baby is not emptying your breasts completely for some reason, use a breast pump to empty your breasts. °· If you go back to work, pump your breasts while at work to stay in time with your nursing schedule. °· Avoid allowing your breasts to become overly filled with milk (engorged). °SEEK MEDICAL CARE IF: °· You have pus-like discharge from the breast. °· Your symptoms do not improve with  the treatment prescribed by your health care provider within 2 days. °SEEK IMMEDIATE MEDICAL CARE IF: °· Your pain and swelling are getting worse. °· You have pain that is not controlled with medicine. °· You have a red line extending from the breast toward your armpit. °· You have a fever or persistent symptoms for more than 2-3 days. °· You have a fever and your symptoms suddenly get worse. °MAKE SURE YOU:  °· Understand these instructions. °· Will watch your condition. °· Will get help right away if you are not doing well or get worse. °Document Released: 10/26/2004 Document Revised: 07/06/2013 Document Reviewed: 02/04/2013 °[employer]® Patient Information ©2015 [employer]. This information is not intended to replace advice given to you by your health care provider. Make sure you discuss any questions you have with your health care provider. °Breastfeeding °Deciding to breastfeed is one of the best choices you can make for you and your baby. A change in hormones during pregnancy causes your breast tissue to grow and increases the number and size of your milk ducts. These hormones also allow proteins, sugars, and fats from your blood supply to make breast milk in your milk-producing glands. Hormones prevent breast milk from being released before your baby is born as well as prompt milk flow after birth. Once breastfeeding has begun, thoughts of your baby, as well as his or her sucking or crying, can stimulate the release of milk from your milk-producing glands.  °BENEFITS OF BREASTFEEDING °For Your Baby °· Your first milk (colostrum) helps your baby's digestive system function better.   °· There are antibodies in your milk that help your baby fight off infections.   °· Your baby has a lower incidence of asthma, allergies, and sudden infant death syndrome.   °· The nutrients in breast milk are better for your baby than infant formulas and are designed uniquely for your baby's needs.   °· Breast milk improves your  baby's brain development.   °· Your baby is less likely to develop other conditions, such as childhood obesity, asthma, or type 2 diabetes mellitus.   °For You  °· Breastfeeding helps to create a very special bond between you and your baby.   °· Breastfeeding is convenient. Breast milk is always available at the correct temperature and costs nothing.   °· Breastfeeding helps to burn calories and helps you lose the weight gained during pregnancy.   °· Breastfeeding makes your uterus contract to its prepregnancy size faster and slows bleeding (lochia) after you give birth.   °· Breastfeeding helps to lower your risk of developing type 2 diabetes mellitus, osteoporosis, and breast or ovarian cancer later in life. °SIGNS THAT YOUR BABY IS HUNGRY °Early Signs of Hunger  °· Increased alertness or activity. °· Stretching. °· Movement of the head from side to side. °· Movement of the head and opening of the mouth when the corner of the mouth or cheek is stroked (rooting). °· Increased sucking sounds, smacking lips, cooing, sighing, or squeaking. °· Hand-to-mouth movements. °· Increased sucking of   fingers or hands. °Late Signs of Hunger °· Fussing. °· Intermittent crying. °Extreme Signs of Hunger °Signs of extreme hunger will require calming and consoling before your baby will be able to breastfeed successfully. Do not wait for the following signs of extreme hunger to occur before you initiate breastfeeding:   °· Restlessness. °· A loud, strong cry. °·  Screaming. °BREASTFEEDING BASICS °Breastfeeding Initiation °· Find a comfortable place to sit or lie down, with your neck and back well supported. °· Place a pillow or rolled up blanket under your baby to bring him or her to the level of your breast (if you are seated). Nursing pillows are specially designed to help support your arms and your baby while you breastfeed. °· Make sure that your baby's abdomen is facing your abdomen.   °· Gently massage your breast. With your  fingertips, massage from your chest wall toward your nipple in a circular motion. This encourages milk flow. You may need to continue this action during the feeding if your milk flows slowly. °· Support your breast with 4 fingers underneath and your thumb above your nipple. Make sure your fingers are well away from your nipple and your baby's mouth.   °· Stroke your baby's lips gently with your finger or nipple.   °· When your baby's mouth is open wide enough, quickly bring your baby to your breast, placing your entire nipple and as much of the colored area around your nipple (areola) as possible into your baby's mouth.   °¨ More areola should be visible above your baby's upper lip than below the lower lip.   °¨ Your baby's tongue should be between his or her lower gum and your breast.   °· Ensure that your baby's mouth is correctly positioned around your nipple (latched). Your baby's lips should create a seal on your breast and be turned out (everted). °· It is common for your baby to suck about 2-3 minutes in order to start the flow of breast milk. °Latching °Teaching your baby how to latch on to your breast properly is very important. An improper latch can cause nipple pain and decreased milk supply for you and poor weight gain in your baby. Also, if your baby is not latched onto your nipple properly, he or she may swallow some air during feeding. This can make your baby fussy. Burping your baby when you switch breasts during the feeding can help to get rid of the air. However, teaching your baby to latch on properly is still the best way to prevent fussiness from swallowing air while breastfeeding. °Signs that your baby has successfully latched on to your nipple:    °· Silent tugging or silent sucking, without causing you pain.   °· Swallowing heard between every 3-4 sucks.   °·  Muscle movement above and in front of his or her ears while sucking.   °Signs that your baby has not successfully latched on to  nipple:  °· Sucking sounds or smacking sounds from your baby while breastfeeding. °· Nipple pain. °If you think your baby has not latched on correctly, slip your finger into the corner of your baby's mouth to break the suction and place it between your baby's gums. Attempt breastfeeding initiation again. °Signs of Successful Breastfeeding °Signs from your baby:   °· A gradual decrease in the number of sucks or complete cessation of sucking.   °· Falling asleep.   °· Relaxation of his or her body.   °· Retention of a small amount of milk in his or her mouth.   °· Letting go   of your breast by himself or herself. °Signs from you: °· Breasts that have increased in firmness, weight, and size 1-3 hours after feeding.   °· Breasts that are softer immediately after breastfeeding. °· Increased milk volume, as well as a change in milk consistency and color by the fifth day of breastfeeding.   °· Nipples that are not sore, cracked, or bleeding. °Signs That Your Baby is Getting Enough Milk °· Wetting at least 3 diapers in a 24-hour period. The urine should be clear and pale yellow by age 5 days. °· At least 3 stools in a 24-hour period by age 5 days. The stool should be soft and yellow. °· At least 3 stools in a 24-hour period by age 7 days. The stool should be seedy and yellow. °· No loss of weight greater than 10% of birth weight during the first 3 days of age. °· Average weight gain of 4-7 ounces (113-198 g) per week after age 4 days. °· Consistent daily weight gain by age 5 days, without weight loss after the age of 2 weeks. °After a feeding, your baby may spit up a small amount. This is common. °BREASTFEEDING FREQUENCY AND DURATION °Frequent feeding will help you make more milk and can prevent sore nipples and breast engorgement. Breastfeed when you feel the need to reduce the fullness of your breasts or when your baby shows signs of hunger. This is called "breastfeeding on demand." Avoid introducing a pacifier to your  baby while you are working to establish breastfeeding (the first 4-6 weeks after your baby is born). After this time you may choose to use a pacifier. Research has shown that pacifier use during the first year of a baby's life decreases the risk of sudden infant death syndrome (SIDS). °Allow your baby to feed on each breast as long as he or she wants. Breastfeed until your baby is finished feeding. When your baby unlatches or falls asleep while feeding from the first breast, offer the second breast. Because newborns are often sleepy in the first few weeks of life, you may need to awaken your baby to get him or her to feed. °Breastfeeding times will vary from baby to baby. However, the following rules can serve as a guide to help you ensure that your baby is properly fed: °· Newborns (babies 4 weeks of age or younger) may breastfeed every 1-3 hours. °· Newborns should not go longer than 3 hours during the day or 5 hours during the night without breastfeeding. °· You should breastfeed your baby a minimum of 8 times in a 24-hour period until you begin to introduce solid foods to your baby at around 6 months of age. °BREAST MILK PUMPING °Pumping and storing breast milk allows you to ensure that your baby is exclusively fed your breast milk, even at times when you are unable to breastfeed. This is especially important if you are going back to work while you are still breastfeeding or when you are not able to be present during feedings. Your lactation consultant can give you guidelines on how long it is safe to store breast milk.  °A breast pump is a machine that allows you to pump milk from your breast into a sterile bottle. The pumped breast milk can then be stored in a refrigerator or freezer. Some breast pumps are operated by hand, while others use electricity. Ask your lactation consultant which type will work best for you. Breast pumps can be purchased, but some hospitals and breastfeeding support groups   lease  breast pumps on a monthly basis. A lactation consultant can teach you how to hand express breast milk, if you prefer not to use a pump.  °CARING FOR YOUR BREASTS WHILE YOU BREASTFEED °Nipples can become dry, cracked, and sore while breastfeeding. The following recommendations can help keep your breasts moisturized and healthy: °· Avoid using soap on your nipples.   °· Wear a supportive bra. Although not required, special nursing bras and tank tops are designed to allow access to your breasts for breastfeeding without taking off your entire bra or top. Avoid wearing underwire-style bras or extremely tight bras. °· Air dry your nipples for 3-4 minutes after each feeding.   °· Use only cotton bra pads to absorb leaked breast milk. Leaking of breast milk between feedings is normal.   °· Use lanolin on your nipples after breastfeeding. Lanolin helps to maintain your skin's normal moisture barrier. If you use pure lanolin, you do not need to wash it off before feeding your baby again. Pure lanolin is not toxic to your baby. You may also hand express a few drops of breast milk and gently massage that milk into your nipples and allow the milk to air dry. °In the first few weeks after giving birth, some women experience extremely full breasts (engorgement). Engorgement can make your breasts feel heavy, warm, and tender to the touch. Engorgement peaks within 3-5 days after you give birth. The following recommendations can help ease engorgement: °· Completely empty your breasts while breastfeeding or pumping. You may want to start by applying warm, moist heat (in the shower or with warm water-soaked hand towels) just before feeding or pumping. This increases circulation and helps the milk flow. If your baby does not completely empty your breasts while breastfeeding, pump any extra milk after he or she is finished. °· Wear a snug bra (nursing or regular) or tank top for 1-2 days to signal your body to slightly decrease milk  production. °· Apply ice packs to your breasts, unless this is too uncomfortable for you. °· Make sure that your baby is latched on and positioned properly while breastfeeding. °If engorgement persists after 48 hours of following these recommendations, contact your health care provider or a lactation consultant. °OVERALL HEALTH CARE RECOMMENDATIONS WHILE BREASTFEEDING °· Eat healthy foods. Alternate between meals and snacks, eating 3 of each per day. Because what you eat affects your breast milk, some of the foods may make your baby more irritable than usual. Avoid eating these foods if you are sure that they are negatively affecting your baby. °· Drink milk, fruit juice, and water to satisfy your thirst (about 10 glasses a day).   °· Rest often, relax, and continue to take your prenatal vitamins to prevent fatigue, stress, and anemia. °· Continue breast self-awareness checks. °· Avoid chewing and smoking tobacco. °· Avoid alcohol and drug use. °Some medicines that may be harmful to your baby can pass through breast milk. It is important to ask your health care provider before taking any medicine, including all over-the-counter and prescription medicine as well as vitamin and herbal supplements. °It is possible to become pregnant while breastfeeding. If birth control is desired, ask your health care provider about options that will be safe for your baby. °SEEK MEDICAL CARE IF:  °· You feel like you want to stop breastfeeding or have become frustrated with breastfeeding. °· You have painful breasts or nipples. °· Your nipples are cracked or bleeding. °· Your breasts are red, tender, or warm. °· You have   a swollen area on either breast. °· You have a fever or chills. °· You have nausea or vomiting. °· You have drainage other than breast milk from your nipples. °· Your breasts do not become full before feedings by the fifth day after you give birth. °· You feel sad and depressed. °· Your baby is too sleepy to eat  well. °· Your baby is having trouble sleeping.   °· Your baby is wetting less than 3 diapers in a 24-hour period. °· Your baby has less than 3 stools in a 24-hour period. °· Your baby's skin or the white part of his or her eyes becomes yellow.   °· Your baby is not gaining weight by 5 days of age. °SEEK IMMEDIATE MEDICAL CARE IF:  °· Your baby is overly tired (lethargic) and does not want to wake up and feed. °· Your baby develops an unexplained fever. °Document Released: 07/01/2005 Document Revised: 07/06/2013 Document Reviewed: 12/23/2012 °[employer]® Patient Information ©2015 [employer]. This information is not intended to replace advice given to you by your health care provider. Make sure you discuss any questions you have with your health care provider. ° °

## 2014-10-22 NOTE — Anesthesia Postprocedure Evaluation (Signed)
  Anesthesia Post-op Note  Patient: Cynthia Estes  Procedure(s) Performed: * No procedures listed *  Patient Location: Mother/Baby  Anesthesia Type:Epidural  Level of Consciousness: awake and alert   Airway and Oxygen Therapy: Patient Spontanous Breathing  Post-op Pain: mild  Post-op Assessment: Post-op Vital signs reviewed, Patient's Cardiovascular Status Stable, Respiratory Function Stable, No signs of Nausea or vomiting, Pain level controlled, No headache, No residual numbness and No residual motor weakness  Post-op Vital Signs: Reviewed  Last Vitals:  Filed Vitals:   10/22/14 0540  BP: 97/58  Pulse: 72  Temp: 36.7 C  Resp: 18    Complications: No apparent anesthesia complications

## 2014-10-22 NOTE — Lactation Note (Signed)
This note was copied from the chart of Cynthia Estes. Lactation Consultation Note Mom has been bottle feeding and told staff that she was only going to bottle feed. Mom told Dr. Ezequiel EssexGable that she was going to pump and bottle feed. Mom has purchased pump from [employer]. Explained supply and demand. Encouraged mom that if she is going to pump and bottle feed, she needs to pump every three hours. Suggested a hands free bra so mom could massage breast during pumping.  Discussed engorgement, management, and prevention. Discussed how not pumping on a timely matter and giving formula will decrease milk supply. Reminded of LC support group and LC out pt. Services.  Patient Name: Cynthia Leonel Ramsayracy Carrozza RUEAV'WToday's Date: 10/22/2014 Reason for consult: Follow-up assessment   Maternal Data    Feeding Feeding Type: Formula Nipple Type: Slow - flow  LATCH Score/Interventions                      Lactation Tools Discussed/Used     Consult Status Consult Status: Complete Date: 10/22/14    Charyl DancerCARVER, Allysson Rinehimer G 10/22/2014, 2:45 PM
